# Patient Record
Sex: Male | Born: 2005
Health system: Southern US, Community
[De-identification: ages and names within clinical notes are randomized; demographics above are authoritative.]

## PROBLEM LIST (undated history)

## (undated) DIAGNOSIS — D709 Neutropenia, unspecified: Secondary | ICD-10-CM

## (undated) DIAGNOSIS — J302 Other seasonal allergic rhinitis: Secondary | ICD-10-CM

## (undated) HISTORY — PX: MYRINGOTOMY: SUR874

## (undated) HISTORY — PX: ELBOW SURGERY: SHX618

---
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
## 2023-07-12 ENCOUNTER — Encounter (HOSPITAL_BASED_OUTPATIENT_CLINIC_OR_DEPARTMENT_OTHER): Payer: Self-pay | Admitting: Emergency Medicine

## 2023-07-12 ENCOUNTER — Emergency Department (HOSPITAL_BASED_OUTPATIENT_CLINIC_OR_DEPARTMENT_OTHER)
Admission: EM | Admit: 2023-07-12 | Discharge: 2023-07-12 | Disposition: A | Discharge status: Home / Self Care | Payer: Medicaid Other | Attending: Emergency Medicine | Admitting: Emergency Medicine

## 2023-07-12 ENCOUNTER — Other Ambulatory Visit: Payer: Self-pay

## 2023-07-12 ENCOUNTER — Emergency Department (HOSPITAL_BASED_OUTPATIENT_CLINIC_OR_DEPARTMENT_OTHER): Payer: Medicaid Other

## 2023-07-12 DIAGNOSIS — M25531 Pain in right wrist: Secondary | ICD-10-CM | POA: Diagnosis present

## 2023-07-12 HISTORY — DX: Neutropenia, unspecified: D70.9

## 2023-07-12 LAB — CBC WITH DIFFERENTIAL/PLATELET
Abs Immature Granulocytes: 0.02 10*3/uL (ref 0.00–0.07)
Basophils Absolute: 0.1 10*3/uL (ref 0.0–0.1)
Basophils Relative: 2 %
Eosinophils Absolute: 0.1 10*3/uL (ref 0.0–0.5)
Eosinophils Relative: 1 %
HCT: 42.2 % (ref 39.0–52.0)
Hemoglobin: 13 g/dL (ref 13.0–17.0)
Immature Granulocytes: 0 %
Lymphocytes Relative: 75 %
Lymphs Abs: 6 10*3/uL — ABNORMAL HIGH (ref 0.7–4.0)
MCH: 27 pg (ref 26.0–34.0)
MCHC: 30.8 g/dL (ref 30.0–36.0)
MCV: 87.6 fL (ref 80.0–100.0)
Monocytes Absolute: 0.5 10*3/uL (ref 0.1–1.0)
Monocytes Relative: 7 %
Neutro Abs: 1.2 10*3/uL — ABNORMAL LOW (ref 1.7–7.7)
Neutrophils Relative %: 15 %
Platelets: 221 10*3/uL (ref 150–400)
RBC: 4.82 MIL/uL (ref 4.22–5.81)
RDW: 12.9 % (ref 11.5–15.5)
Smear Review: NORMAL
WBC: 8 10*3/uL (ref 4.0–10.5)
nRBC: 0 % (ref 0.0–0.2)

## 2023-07-12 LAB — BASIC METABOLIC PANEL
Anion gap: 7 (ref 5–15)
BUN: 14 mg/dL (ref 6–20)
CO2: 25 mmol/L (ref 22–32)
Calcium: 8.7 mg/dL — ABNORMAL LOW (ref 8.9–10.3)
Chloride: 105 mmol/L (ref 98–111)
Creatinine, Ser: 0.72 mg/dL (ref 0.61–1.24)
GFR, Estimated: >60 mL/min (ref >60)
Glucose, Bld: 97 mg/dL (ref 70–99)
Potassium: 3.7 mmol/L (ref 3.5–5.1)
Sodium: 137 mmol/L (ref 135–145)

## 2023-07-12 NOTE — ED Notes (Signed)
D/c paperwork reviewed with pt, including follow up care.  No questions or concerns voiced at time of d/c. . Pt verbalized understanding, Ambulatory without assistance to ED exit, NAD.   

## 2023-07-12 NOTE — Discharge Instructions (Addendum)
 You were evaluated in the emergency room for right wrist pain.  Your x-rays and lab work did not show any acute abnormality.  You were placed in a wrist splint.  Please wear this daily.  You were provided a referral to follow-up with orthopedics.  Please call to make an appointment.  If you experience any new or worsening symptoms including extreme worsening of pain, numbness in your arm please return to the emergency room.

## 2023-07-12 NOTE — ED Triage Notes (Addendum)
 Pt via POV c/o right arm pain x 2 days with no known injury. Pt states pain is localized to right wrist area. No deformity or swelling noted. Pain rated 5/10 with no meds PTA. Pt has hx idiopathic neutropenia and Bechets Disease and family is worried that this may signify a change in labs.

## 2023-07-12 NOTE — ED Provider Notes (Signed)
[facility] AT [facility] Provider Note   CSN: [id number] Arrival date & time: 07/12/23  1634     History  Chief Complaint  Patient presents with   Arm Pain    Wesley Rubio is a 18 y.o. male newly diagnosed with Bechet's and neutropenia presents with atraumatic right wrist pain x 3 days.  Pain is worse with movement and usage.  Denies any numbness or tingling.  No history of prior injury or trauma to the upper extremity.    Arm Pain   Past Medical History:  Diagnosis Date   Asthma    Chronic idiopathic neutropenia (HCC)    Seasonal allergies         Home Medications Prior to Admission medications   Medication Sig Start Date End Date Taking? Authorizing Provider  albuterol (PROVENTIL) (2.5 MG/3ML) 0.083% nebulizer solution Take 2.5 mg by nebulization every 6 (six) hours as needed.    [provider]  cetirizine (ZYRTEC) 1 MG/ML syrup Take by mouth daily.    [provider]  ibuprofen (ADVIL) 200 MG tablet Take 2 tablets (400 mg total) by mouth every 6 (six) hours as needed for moderate pain. 08/22/15   Abram Sander, MD      Allergies    Patient has no known allergies.    Review of Systems   Review of Systems  Musculoskeletal:  Positive for myalgias.    Physical Exam Updated Vital Signs BP 109/72   Pulse 81   Temp 98.6 F (37 C)   Resp 15   Ht 5\' 11"  (1.803 m)   Wt 62.6 kg   SpO2 100%   BMI 19.25 kg/m  Physical Exam Vitals and nursing note reviewed.  Constitutional:      General: He is not in acute distress.    Appearance: He is well-developed.  HENT:     Head: Normocephalic and atraumatic.  Eyes:     Conjunctiva/sclera: Conjunctivae normal.  Cardiovascular:     Rate and Rhythm: Normal rate and regular rhythm.     Heart sounds: No murmur heard. Pulmonary:     Effort: Pulmonary effort is normal. No respiratory distress.     Breath sounds: Normal breath sounds.  Abdominal:     Palpations:  Abdomen is soft.     Tenderness: There is no abdominal tenderness.  Musculoskeletal:     Cervical back: Neck supple.     Comments: Examination of right upper extremity demonstrates mild tenderness to first and second dorsal compartments, no significant swelling.  No ecchymosis.  No overlying erythema or warmth, induration, crepitus or fluctuance.  Capable of making a full fist, motor function intact.  NVI.  Radial pulse symmetric.  Cap refill less than 2 secs, + Finkelstein's  Skin:    General: Skin is warm and dry.     Capillary Refill: Capillary refill takes less than 2 seconds.  Neurological:     Mental Status: He is alert.  Psychiatric:        Mood and Affect: Mood normal.     ED Results / Procedures / Treatments   Labs (all labs ordered are listed, but only abnormal results are displayed) Labs Reviewed  BASIC METABOLIC PANEL - Abnormal; Notable for the following components:      Result Value   Calcium 8.7 (*)    All other components within normal limits  CBC WITH DIFFERENTIAL/PLATELET    EKG None  Radiology DG Wrist Complete Right Result Date: 07/12/2023 CLINICAL DATA:  Right wrist pain EXAM: RIGHT WRIST - COMPLETE 3+ VIEW COMPARISON:  08/22/2015 FINDINGS: There is no evidence of fracture or dislocation. There is no evidence of arthropathy or other focal bone abnormality. Soft tissues are unremarkable. IMPRESSION: Negative. Electronically Signed   By: Charlett Nose M.D.   On: 07/12/2023 19:25    Procedures Procedures    Medications Ordered in ED Medications - No data to display  ED Course/ Medical Decision Making/ A&P                                 Medical Decision Making Amount and/or Complexity of Data Reviewed Labs: ordered. Radiology: ordered.   This patient presents to the ED with chief complaint(s) of wrist pain.  The complaint involves an extensive differential diagnosis and also carries with it a high risk of complications and morbidity.   pertinent  past medical history as listed in HPI  The differential diagnosis includes  Fracture, sprain, dislocation, tendon/vascular/nerve injury, DVT, carpal tunnel, tendinitis, septic joint, gout, compartment syndrome The initial plan is to  Plain films and labs ordered in triage Additional history obtained: Additional history obtained from family Records reviewed Care Everywhere/External Records  Initial Assessment:   Hemodynamically stable, afebrile, nontoxic-appearing patient presenting with atraumatic right wrist pain x 3 days.  On exam he has tenderness over the first and second dorsal compartments.  There is no overlying erythema, warmth, induration, crepitus or fluctuance.  Overall no suspicion for infectious etiology.  Compartments are soft.  Do not suspect compartment syndrome.  He has no snuffbox tenderness.  He moves wrist and digits without difficulty.  No suspicion for septic joint or gout.  He is neurovascularly intact.  No suspicion for vascular or nerve injury.  Overall exam is most consistent with musculoskeletal etiology such as tendinitis.  Will place in thumb spica splint and discharge with Ortho follow-up. Independent ECG interpretation:  none  Independent labs interpretation:  The following labs were independently interpreted:  CBC and BMP without significant abnormality  Independent visualization and interpretation of imaging: I independently visualized the following imaging with scope of interpretation limited to determining acute life threatening conditions related to emergency care: Wrist x-ray, which revealed no acute abnormality  Treatment and Reassessment: none  Consultations obtained:   none  Disposition:   Patient will be discharged home.  Placed in a thumb spica.  Provided referral to orthopedics. The patient has been appropriately medically screened and/or stabilized in the ED. I have low suspicion for any other emergent medical condition which would require  further screening, evaluation or treatment in the ED or require inpatient management. At time of discharge the patient is hemodynamically stable and in no acute distress. I have discussed work-up results and diagnosis with patient and answered all questions. Patient is agreeable with discharge plan. We discussed strict return precautions for returning to the emergency department and they verbalized understanding.     Social Determinants of Health:   none  This note was dictated with voice recognition software.  Despite best efforts at proofreading, errors may have occurred which can change the documentation meaning.          Final Clinical Impression(s) / ED Diagnoses Final diagnoses:  Right wrist pain    Rx / DC Orders ED Discharge Orders     None         Fabienne Bruns 07/12/23 2032    Virgina Norfolk, DO  07/12/23 2208  

## 2023-08-17 ENCOUNTER — Other Ambulatory Visit: Payer: Self-pay

## 2023-08-17 ENCOUNTER — Encounter (HOSPITAL_BASED_OUTPATIENT_CLINIC_OR_DEPARTMENT_OTHER): Payer: Self-pay | Admitting: *Deleted

## 2023-08-17 ENCOUNTER — Emergency Department (HOSPITAL_BASED_OUTPATIENT_CLINIC_OR_DEPARTMENT_OTHER)
Admission: EM | Admit: 2023-08-17 | Discharge: 2023-08-17 | Disposition: A | Discharge status: Home / Self Care | Attending: Emergency Medicine | Admitting: Emergency Medicine

## 2023-08-17 DIAGNOSIS — R000 Tachycardia, unspecified: Secondary | ICD-10-CM | POA: Insufficient documentation

## 2023-08-17 DIAGNOSIS — J45909 Unspecified asthma, uncomplicated: Secondary | ICD-10-CM | POA: Diagnosis not present

## 2023-08-17 DIAGNOSIS — Z7951 Long term (current) use of inhaled steroids: Secondary | ICD-10-CM | POA: Insufficient documentation

## 2023-08-17 DIAGNOSIS — K529 Noninfective gastroenteritis and colitis, unspecified: Secondary | ICD-10-CM | POA: Diagnosis not present

## 2023-08-17 DIAGNOSIS — R112 Nausea with vomiting, unspecified: Secondary | ICD-10-CM | POA: Diagnosis present

## 2023-08-17 LAB — CBC
HCT: 47.8 % (ref 39.0–52.0)
Hemoglobin: 15.3 g/dL (ref 13.0–17.0)
MCH: 27.5 pg (ref 26.0–34.0)
MCHC: 32 g/dL (ref 30.0–36.0)
MCV: 86 fL (ref 80.0–100.0)
Platelets: 166 10*3/uL (ref 150–400)
RBC: 5.56 MIL/uL (ref 4.22–5.81)
RDW: 12.7 % (ref 11.5–15.5)
WBC: 5.9 10*3/uL (ref 4.0–10.5)
nRBC: 0 % (ref 0.0–0.2)

## 2023-08-17 LAB — URINALYSIS, ROUTINE W REFLEX MICROSCOPIC
Glucose, UA: NEGATIVE mg/dL
Hgb urine dipstick: NEGATIVE
Ketones, ur: 15 mg/dL — AB
Leukocytes,Ua: NEGATIVE
Nitrite: NEGATIVE
Protein, ur: 30 mg/dL — AB
Specific Gravity, Urine: >=1.03 (ref 1.005–1.030)
pH: 6 (ref 5.0–8.0)

## 2023-08-17 LAB — COMPREHENSIVE METABOLIC PANEL WITH GFR
ALT: 16 U/L (ref 0–44)
AST: 28 U/L (ref 15–41)
Albumin: 4.3 g/dL (ref 3.5–5.0)
Alkaline Phosphatase: 83 U/L (ref 38–126)
Anion gap: 11 (ref 5–15)
BUN: 11 mg/dL (ref 6–20)
CO2: 23 mmol/L (ref 22–32)
Calcium: 9 mg/dL (ref 8.9–10.3)
Chloride: 103 mmol/L (ref 98–111)
Creatinine, Ser: 0.82 mg/dL (ref 0.61–1.24)
GFR, Estimated: >60 mL/min (ref >60)
Glucose, Bld: 96 mg/dL (ref 70–99)
Potassium: 3.8 mmol/L (ref 3.5–5.1)
Sodium: 137 mmol/L (ref 135–145)
Total Bilirubin: 1.8 mg/dL — ABNORMAL HIGH (ref 0.0–1.2)
Total Protein: 7.9 g/dL (ref 6.5–8.1)

## 2023-08-17 LAB — URINALYSIS, MICROSCOPIC (REFLEX)

## 2023-08-17 LAB — RESP PANEL BY RT-PCR (RSV, FLU A&B, COVID)  RVPGX2
Influenza A by PCR: NEGATIVE
Influenza B by PCR: NEGATIVE
Resp Syncytial Virus by PCR: NEGATIVE
SARS Coronavirus 2 by RT PCR: NEGATIVE

## 2023-08-17 LAB — LIPASE, BLOOD: Lipase: 30 U/L (ref 11–51)

## 2023-08-17 MED ORDER — ONDANSETRON HCL 4 MG/2ML IJ SOLN
4.0000 mg | Freq: Once | INTRAMUSCULAR | Status: AC
  Administered 2023-08-17: 4 mg via INTRAVENOUS
  Filled 2023-08-17: qty 2

## 2023-08-17 MED ORDER — PANTOPRAZOLE SODIUM 40 MG IV SOLR
40.0000 mg | Freq: Once | INTRAVENOUS | Status: AC
  Administered 2023-08-17: 40 mg via INTRAVENOUS
  Filled 2023-08-17: qty 10

## 2023-08-17 MED ORDER — ONDANSETRON 4 MG PO TBDP: 4.0000 mg | ORAL_TABLET | Freq: Three times a day (TID) | ORAL | 0 refills | Status: AC | PRN

## 2023-08-17 MED ORDER — LACTATED RINGERS IV BOLUS
1000.0000 mL | Freq: Once | INTRAVENOUS | Status: AC
  Administered 2023-08-17: 1000 mL via INTRAVENOUS

## 2023-08-17 MED ORDER — FAMOTIDINE 20 MG PO TABS: 20.0000 mg | ORAL_TABLET | Freq: Two times a day (BID) | ORAL | 0 refills | Status: AC

## 2023-08-17 NOTE — ED Provider Notes (Signed)
 [place] [facility] Provider Note   CSN: [id number] Arrival date & time: 08/17/23  1221     History  Chief Complaint  Patient presents with   Emesis    Wesley Rubio is a 18 y.o. male patient with past medical history of asthma, seasonal allergies is reporting to emergency room with 1 day of nausea, vomiting, diarrhea.  Patient reports 7 episodes of vomit.  Reports that the last episode of vomit had small amount of streaking right pink color to it which she suspects is blood.  He has not had any repeat episode of vomiting or diarrhea for 2 and half hours.  Reports that he had 2 episodes of loose stools without hematochezia or melena.  Patient reports he has had some generalized cramping abdominal pain but this is now resolved.  Denies any fever or chills.  Reports suspicious food intake yesterday evening approximately 12 hours prior to onset.  Reports no recent out of the country travel, no recent antibiotic use.  No recent surgeries.   Emesis      Home Medications Prior to Admission medications   Medication Sig Start Date End Date Taking? Authorizing Provider  albuterol (PROVENTIL) (2.5 MG/3ML) 0.083% nebulizer solution Take 2.5 mg by nebulization every 6 (six) hours as needed.    [provider]  cetirizine (ZYRTEC) 1 MG/ML syrup Take by mouth daily.    [provider]  ibuprofen (ADVIL) 200 MG tablet Take 2 tablets (400 mg total) by mouth every 6 (six) hours as needed for moderate pain. 08/22/15   Abram Sander, MD      Allergies    Patient has no known allergies.    Review of Systems   Review of Systems  Gastrointestinal:  Positive for vomiting.    Physical Exam Updated Vital Signs BP 112/70 (BP Location: Right Arm)   Pulse (!) 111   Temp 98.7 F (37.1 C) (Oral)   Resp 18   Ht 5\' 11"  (1.803 m)   Wt 59.4 kg   SpO2 99%   BMI 18.27 kg/m  Physical Exam Vitals and nursing note reviewed.  Constitutional:       General: He is not in acute distress.    Appearance: He is not toxic-appearing.  HENT:     Head: Normocephalic and atraumatic.  Eyes:     General: No scleral icterus.    Conjunctiva/sclera: Conjunctivae normal.  Cardiovascular:     Rate and Rhythm: Regular rhythm. Tachycardia present.     Pulses: Normal pulses.     Heart sounds: Normal heart sounds.  Pulmonary:     Effort: Pulmonary effort is normal. No respiratory distress.     Breath sounds: Normal breath sounds.  Abdominal:     General: Abdomen is flat. Bowel sounds are normal.     Palpations: Abdomen is soft.     Tenderness: There is no abdominal tenderness.  Musculoskeletal:     Right lower leg: No edema.     Left lower leg: No edema.  Skin:    General: Skin is warm and dry.     Findings: No lesion.  Neurological:     General: No focal deficit present.     Mental Status: He is alert and oriented to person, place, and time. Mental status is at baseline.     ED Results / Procedures / Treatments   Labs (all labs ordered are listed, but only abnormal results are displayed) Labs Reviewed  COMPREHENSIVE METABOLIC PANEL  WITH GFR - Abnormal; Notable for the following components:      Result Value   Total Bilirubin 1.8 (*)    All other components within normal limits  URINALYSIS, ROUTINE W REFLEX MICROSCOPIC - Abnormal; Notable for the following components:   Bilirubin Urine SMALL (*)    Ketones, ur 15 (*)    Protein, ur 30 (*)    All other components within normal limits  URINALYSIS, MICROSCOPIC (REFLEX) - Abnormal; Notable for the following components:   Bacteria, UA RARE (*)    All other components within normal limits  RESP PANEL BY RT-PCR (RSV, FLU A&B, COVID)  RVPGX2  LIPASE, BLOOD  CBC    EKG None  Radiology No results found.  Procedures Procedures    Medications Ordered in ED Medications - No data to display  ED Course/ Medical Decision Making/ A&P Clinical Course as of 08/17/23 1650  Tue Aug 17, 2023  1600 Feeling better. Would like to try and drink something. Has not have repeat NVD during visit.  [JB]  1630 Tolerating oral intake.  [JB]    Clinical Course User Index [JB] Avani Sensabaugh, Horald Chestnut, PA-C                                 Medical Decision Making Amount and/or Complexity of Data Reviewed Labs: ordered.  Risk Prescription drug management.   This patient presents to the ED for concern of emesis, this involves an extensive number of treatment options, and is a complaint that carries with it a high risk of complications and morbidity.  The differential diagnosis includes appendicitis, small bowel obstruction, gastritis, gastroenteritis, cholecystitis, tolerate toco lithiasis, medication side effect, dehydration, electrolyte abnormality   Co morbidities that complicate the patient evaluation  Allergies    Lab Tests:  I personally interpreted labs.  The pertinent results include:   CBC without leukocytosis and hemoglobin is 15.3 CMP without electrolyte abnormality, no elevation in liver enzymes and normal kidney function.  Total bili 1.8. Lipase 30 Respiratory panel negative UA positive for small amount of ketones.   Cardiac Monitoring: / EKG:  The patient was maintained on a cardiac monitor.     Problem List / ED Course / Critical interventions / Medication management  Patient presenting to emergency room, vomiting, diarrhea.  He reports suspicious food intake.  Symptoms are consistent with a gastroenteritis.  On arrival he is slightly tachycardic which I feel is secondary to volume loss from vomiting and diarrhea.  Labs are overall reassuring although does have some amount of ketones in UA.  Lungs clear to auscultation bilaterally.  Moist mucous membranes.  No focal area of abdominal tenderness on exam.  No jaundice or scleral icterus.  He is hemodynamically stable and well-appearing.  He has noticed last episode of vomiting to have small amount of blood in vomit,  which he quantifies to less than a tablespoon. No blood in stool.  Will give fluids, Zofran and Protonix.  Will reassess after medications.  Patient observed for short time in ER.  No repeat episodes of nausea vomiting diarrhea.  His tachycardia improved after fluids.  He is now tolerating oral intake.  He is feeling much better and requesting discharge home.  We did discuss symptom management and return precautions.  He is hemodynamically stable well-appearing repeat exam of the abdomen shows abdomen is still nontender and nondistended. Appropriate for discharge.  I ordered medication including LR, zofran,  protonix  Reevaluation of the patient after these medicines showed that the patient improved I have reviewed the patients home medicines and have made adjustments as needed   Plan  F/u w/ PCP in 2-3d to ensure resolution of sx.  Patient was given return precautions. Patient stable for discharge at this time.  Patient educated on sx/dx and verbalized understanding of plan. Return to ER w/ new or worsening sx.          Final Clinical Impression(s) / ED Diagnoses Final diagnoses:  Gastroenteritis    Rx / DC Orders ED Discharge Orders     None         Smitty Knudsen, PA-C 08/17/23 1652    Jacalyn Lefevre, MD 08/21/23 1017

## 2023-08-17 NOTE — ED Notes (Signed)
 D/c paperwork reviewed with pt, including prescriptions and follow up care.  All questions and/or concerns addressed at time of d/c.  No further needs expressed. . Pt verbalized understanding, Ambulatory with family to ED exit, NAD.

## 2023-08-17 NOTE — Discharge Instructions (Addendum)
 Make sure that he is staying well-hydrated with water he can alternate Pedialyte and Gatorade drink.  Try bland foods like toast, applesauce.  Have sent Pepcid to pharmacy please take consistently daily for the next 1 to 2 weeks.  You can take Zofran as needed for nausea vomiting.  Take Pepto-Bismol or Imodium which are available over-the-counter if diarrhea reoccurs.  Return to ER with any new or worsening symptoms.

## 2023-08-17 NOTE — ED Triage Notes (Addendum)
 Pt is here for vomiting this am.  Pt reports having about 7 episodes of vomiting and on the last time he noted blood in the vomit. Pt has also been having body aches and diarrhea this am.
